# Patient Record
Sex: Female | Born: 1985 | Race: White | Hispanic: No | Marital: Married | Smoking: Former smoker
Health system: Southern US, Community
[De-identification: ages and names within clinical notes are randomized; demographics above are authoritative.]

## PROBLEM LIST (undated history)

## (undated) ENCOUNTER — Inpatient Hospital Stay (HOSPITAL_COMMUNITY): Payer: Self-pay

## (undated) DIAGNOSIS — Z789 Other specified health status: Secondary | ICD-10-CM

## (undated) DIAGNOSIS — F419 Anxiety disorder, unspecified: Secondary | ICD-10-CM

## (undated) DIAGNOSIS — N200 Calculus of kidney: Secondary | ICD-10-CM

## (undated) DIAGNOSIS — F32A Depression, unspecified: Secondary | ICD-10-CM

## (undated) DIAGNOSIS — Z319 Encounter for procreative management, unspecified: Secondary | ICD-10-CM

## (undated) DIAGNOSIS — Z1589 Genetic susceptibility to other disease: Secondary | ICD-10-CM

## (undated) HISTORY — PX: OTHER SURGICAL HISTORY: SHX169

## (undated) HISTORY — PX: DILATION AND CURETTAGE OF UTERUS: SHX78

---
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
## 2012-04-11 ENCOUNTER — Encounter: Payer: Self-pay | Admitting: Obstetrics and Gynecology

## 2012-04-19 ENCOUNTER — Ambulatory Visit (INDEPENDENT_AMBULATORY_CARE_PROVIDER_SITE_OTHER): Payer: PRIVATE HEALTH INSURANCE | Admitting: Obstetrics and Gynecology

## 2012-04-19 ENCOUNTER — Encounter: Payer: Self-pay | Admitting: Obstetrics and Gynecology

## 2012-04-19 VITALS — BP 110/70 | Ht 67.0 in | Wt 130.0 lb

## 2012-04-19 DIAGNOSIS — Z01419 Encounter for gynecological examination (general) (routine) without abnormal findings: Secondary | ICD-10-CM

## 2012-04-19 DIAGNOSIS — Z124 Encounter for screening for malignant neoplasm of cervix: Secondary | ICD-10-CM

## 2012-04-19 NOTE — Progress Notes (Signed)
Last Pap: 2011 WNL: Yes Regular Periods:yes Contraception: IUD inserted 07/2010  Monthly Breast exam:yes Tetanus<31yrs:yes Nl.Bladder Function:yes Daily BMs:yes Healthy Diet:yes Calcium:no Mammogram:no Exercise:yes Seatbelt: yes Abuse at home: no Stressful work:yes Sigmoid-colonoscopy: n/a Bone Density: No  Subjective:    Melissa Doyle is a 26 y.o. female, No obstetric history on file., who presents for a new patient annual exam. See above. She had a Mirena IUD placed into thousand and 11.  The patient has a maternal aunt and a first cousin (age 45) with breast cancer.  Prior Hysterectomy: No    History   Social History  . Marital Status: Married    Spouse Name: N/A    Number of Children: N/A  . Years of Education: N/A   Social History Main Topics  . Smoking status: Never Smoker   . Smokeless tobacco: None  . Alcohol Use: None  . Drug Use: None  . Sexually Active: None   Other Topics Concern  . None   Social History Narrative  . None    Menstrual cycle:   LMP: Patient's last menstrual period was 04/03/2012.           Cycle: Regular, monthly with normal flow and no severe dysmenorrha  The following portions of the patient's history were reviewed and updated as appropriate: allergies, current medications, past family history, past medical history, past social history, past surgical history and problem list.  Review of Systems Pertinent items are noted in HPI. Breast:Negative for breast lump,nipple discharge or nipple retraction Gastrointestinal: Negative for abdominal pain, change in bowel habits or rectal bleeding Urinary:negative   Objective:    BP 110/70  Ht 5\' 7"  (1.702 m)  Wt 130 lb (58.968 kg)  BMI 20.36 kg/m2  LMP 04/03/2012    Weight:  Wt Readings from Last 1 Encounters:  04/19/12 130 lb (58.968 kg)          BMI: Body mass index is 20.36 kg/(m^2).  General Appearance: Alert, appropriate appearance for age. No acute distress HEENT: Grossly  normal Neck / Thyroid: Supple, no masses, nodes or enlargement Lungs: clear to auscultation bilaterally Back: No CVA tenderness Breast Exam: No masses or nodes.No dimpling, nipple retraction or discharge. Cardiovascular: Regular rate and rhythm. S1, S2, no murmur Gastrointestinal: Soft, non-tender, no masses or organomegaly  ++++++++++++++++++++++++++++++++++++++++++++++++++++++++  Pelvic Exam: External genitalia: normal general appearance Vaginal: normal without tenderness, induration or masses Cervix: normal appearance Adnexa: normal bimanual exam Uterus: normal size shape and consistency Rectovaginal: not indicated IUD string seen in the cervix  ++++++++++++++++++++++++++++++++++++++++++++++++++++++++  Lymphatic Exam: Non-palpable nodes in neck, clavicular, axillary, or inguinal regions Neurologic: Normal speech, no tremor  Psychiatric: Alert and oriented, appropriate affect.   Wet Prep:not applicable Urinalysis:not applicable UPT: Not done   Assessment:    Normal gyn exam   Overweight or obese: No   Pelvic relaxation: No  Maternal aunt and first cousin with breast cancer   Plan:    pap smear return annually or prn Contraception:IUD  STD screen request: none  RPR: No.   HBsAg: No.  Hepatitis C: No.  The updated Pap smear screening guidelines were discussed with the patient. The patient requested that I obtain a Pap smear: Yes.  Kegel exercises discussed: Yes.  Proper diet and regular exercise were reviewed.  Annual mammograms recommended starting at age 32. Proper breast care was discussed. Patient we'll check the results of the BRCA testing her cousin.  If her cousin is positive then we will test our patient.  Screening colonoscopy  is recommended beginning at age 60.  Regular health maintenance was reviewed.  Sleep hygiene was discussed.  Mylinda Latina.D.

## 2012-04-24 LAB — PAP IG W/ RFLX HPV ASCU

## 2012-05-29 ENCOUNTER — Telehealth: Payer: Self-pay | Admitting: Obstetrics and Gynecology

## 2012-05-30 ENCOUNTER — Telehealth: Payer: Self-pay

## 2012-05-30 NOTE — Telephone Encounter (Signed)
Lm for pt to call back

## 2012-06-04 ENCOUNTER — Telehealth: Payer: Self-pay | Admitting: Obstetrics and Gynecology

## 2012-06-04 NOTE — Telephone Encounter (Signed)
TC to pt. LM to return call regarding message. 

## 2012-06-05 ENCOUNTER — Telehealth: Payer: Self-pay | Admitting: Obstetrics and Gynecology

## 2012-06-05 NOTE — Telephone Encounter (Signed)
TC from pt.   States spoke with Dr AVS at last visit. Pt's maternal aunt and her daughter diagnosed with breast cancer. Pt's cousin has tested positive  for  BRCA1  Gene.   Questioning if needs testing and/or early mammogram.  Mother is also in process of testing for BRCA gene.  Dr AVS to be consulted.  Pt agreeable.

## 2012-06-05 NOTE — Telephone Encounter (Signed)
TC to Brandy ir response to VM regarding treatment for +GC 05/09/12.  Unable to verify was treated.  Message sent to [facility] to review.

## 2012-06-06 ENCOUNTER — Telehealth: Payer: Self-pay | Admitting: Obstetrics and Gynecology

## 2012-06-06 NOTE — Telephone Encounter (Signed)
TC to pt. LM to return call.  

## 2015-12-08 DIAGNOSIS — Z30433 Encounter for removal and reinsertion of intrauterine contraceptive device: Secondary | ICD-10-CM | POA: Insufficient documentation

## 2016-04-15 DIAGNOSIS — F418 Other specified anxiety disorders: Secondary | ICD-10-CM | POA: Insufficient documentation

## 2016-11-04 ENCOUNTER — Ambulatory Visit (INDEPENDENT_AMBULATORY_CARE_PROVIDER_SITE_OTHER): Payer: BLUE CROSS/BLUE SHIELD | Admitting: Internal Medicine

## 2016-11-04 DIAGNOSIS — Z9189 Other specified personal risk factors, not elsewhere classified: Secondary | ICD-10-CM

## 2016-11-04 MED ORDER — ELVITEG-COBIC-EMTRICIT-TENOFAF 150-150-200-10 MG PO TABS: 1.0000 | ORAL_TABLET | Freq: Every day | ORAL | 0 refills | Status: DC

## 2016-11-04 MED ORDER — ATOVAQUONE-PROGUANIL HCL 250-100 MG PO TABS: 1.0000 | ORAL_TABLET | Freq: Every day | ORAL | 0 refills | Status: DC

## 2016-11-04 MED ORDER — TYPHOID VACCINE PO CPDR: 1.0000 | DELAYED_RELEASE_CAPSULE | ORAL | 0 refills | Status: DC

## 2016-11-04 MED ORDER — AZITHROMYCIN 500 MG PO TABS: 500.0000 mg | ORAL_TABLET | Freq: Every day | ORAL | 0 refills | Status: DC

## 2016-11-04 NOTE — Patient Instructions (Signed)
[facility]                [address]                   [place], [place] [id number]                      Phone: [phone]                        Fax: [phone]   Planned departure date:   Jan 17th   Planned return date: Jan 28th Countries of travel: [place]  Guidelines for the Prevention & Treatment of Traveler's Diarrhea  Prevention: "Boil it, Peel it, Adriana SimasCook it, or Forget it"   the fewer chances -> lower risk: try to stick to food & water precautions as much as possible"   If it's "piping hot"; it is probably okay, if not, it may not be   Treatment   1) You should always take care to drink lots of fluids in order to avoid dehydration   2) You should bring medications with you in case you come down with a case of diarrhea   3) OTC = bring pepto-bismol - can take with initial abdominal symptoms;                    Imodium - can help slow down your intestinal tract, can help relief cramps                    and diarrhea, can take if no bloody diarrhea  Use azithromycin if needed for traveler's diarrhea  In case of needle stick of blood exposure from HIV patient during medical mission: - wash the area of puncture with soap and water - recommend to get baseline hiv testing  - start taking genvoya with small amount of food, to take daily until complete.more or less same time of day - when you return to the [place], please give us a call for further work up  Guidelines for the Prevention of Malaria  Avoidance:  -fewer mosquito bites = lower risk. Mosquitos can bite at night as well as daytime  -cover up (long sleeve clothing), mosquito nets, screens  -Insect repellent for your skin ( DEET containing lotion > 20%): for clothes ( permethrin spray)   Two days prior to leaving, start malarone for malaria prevention.   Immunizations received today: hepatitis A Future immunizations, if indicated: please check if you have  received your meningitis vaccine  Prior to travel:  1) Be sure to pick up appropriate prescriptions, including medicine you take daily. Do not expect to be able to fill your prescriptions abroad.  2) Strongly consider obtaining traveler's insurance, including emergency evacuation insurance. Most plans in the [place] do not cover participants abroad. (see below for resources)  3) Register at the appropriate [place] embassy or consulate with travel dates so they are aware of your presence in-country and for helpful advice during travel using the [employer] Traveler Enrollment Program (STEP, GuyGalaxy.sihttps://step.state.gov/step).  4) Leave contact information with a relative or friend.  5) Keep a Corporate treasurerphotocopy passport, credit cards in case they become lost or stolen  6) Inform your credit card company that you will be travelling abroad   During travel:  1) If you become ill and need medical advice, the U.[place] WellPointembassy website of the country  you are traveling in general provides a list of English speaking doctors.  We are also available on MyChart for remote consultation if you register prior to travel. 2) Avoid motorcycles or scooters when at all possible. Traffic laws in many countries are lax and accidents occur frequently.  3) Do not take any unnecessary risks that you wouldn't do at home.   Resources:  -Country specific information: http://www.church.org/www.cdc.gov/travel or GuyGalaxy.sihttps://step.state.gov/step  -Chief Strategy OfficerTravel Supplies (DEET, mosquito nets): [employer], [employer] Goods store, [employer], [employer]  -Travel insurance options: gatewayplans.com; FixItGel.esmedexassist.com; travelguard.com or Good Mat[employer]ighbor Insurance, gninsurance.com or info@gninsurance .com, O5250554641-479-1523.   Post Travel:  If you return from your trip ill, call your primary care doctor or our travel clinic @ [phone].   Enjoy your trip and know that with proper pre-travel preparation, most people have an enjoyable and uninterrupted trip!

## 2016-11-04 NOTE — Progress Notes (Signed)
Subjective:   Melissa Doyle is a 30 y.o. female who presents to the Infectious Disease clinic for travel consultation. 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 [place]) Areas in country: unknown Accommodations: guest houses, and hotels Purpose of travel: medical mission/education Prior travel out of [place]: yes  - she is uptodate on tdap, mmr, hep B, and other childhood vaccine   Objective:   Medications:    Assessment:   No contraindications to travel. none     Plan:    pre travel vaccination = recommend hep A and yellow fever and meningococcal if she has not already received it. 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 I recommend to bring with her a month's dose of genvoya to use if needed for post exposure prophylaxis. Gave instructions to how to take medicines and co-pay card

## 2016-11-17 ENCOUNTER — Ambulatory Visit: Payer: BLUE CROSS/BLUE SHIELD

## 2017-07-04 DIAGNOSIS — R053 Chronic cough: Secondary | ICD-10-CM | POA: Insufficient documentation

## 2017-07-04 DIAGNOSIS — J309 Allergic rhinitis, unspecified: Secondary | ICD-10-CM | POA: Insufficient documentation

## 2018-05-07 DIAGNOSIS — D229 Melanocytic nevi, unspecified: Secondary | ICD-10-CM | POA: Insufficient documentation

## 2018-06-02 ENCOUNTER — Encounter (HOSPITAL_COMMUNITY): Payer: Self-pay | Admitting: *Deleted

## 2018-06-02 ENCOUNTER — Inpatient Hospital Stay (HOSPITAL_COMMUNITY): Payer: PRIVATE HEALTH INSURANCE

## 2018-06-02 ENCOUNTER — Inpatient Hospital Stay (HOSPITAL_COMMUNITY)
Admission: AD | Admit: 2018-06-02 | Discharge: 2018-06-02 | Disposition: A | Discharge status: Home / Self Care | Payer: PRIVATE HEALTH INSURANCE | Source: Ambulatory Visit | Attending: Obstetrics and Gynecology | Admitting: Obstetrics and Gynecology

## 2018-06-02 DIAGNOSIS — Z87891 Personal history of nicotine dependence: Secondary | ICD-10-CM | POA: Diagnosis not present

## 2018-06-02 DIAGNOSIS — Z3A01 Less than 8 weeks gestation of pregnancy: Secondary | ICD-10-CM | POA: Insufficient documentation

## 2018-06-02 DIAGNOSIS — O209 Hemorrhage in early pregnancy, unspecified: Secondary | ICD-10-CM | POA: Diagnosis not present

## 2018-06-02 DIAGNOSIS — O208 Other hemorrhage in early pregnancy: Secondary | ICD-10-CM | POA: Insufficient documentation

## 2018-06-02 DIAGNOSIS — R109 Unspecified abdominal pain: Secondary | ICD-10-CM | POA: Diagnosis present

## 2018-06-02 DIAGNOSIS — O26891 Other specified pregnancy related conditions, first trimester: Secondary | ICD-10-CM | POA: Diagnosis not present

## 2018-06-02 HISTORY — DX: Other specified health status: Z78.9

## 2018-06-02 LAB — CBC WITH DIFFERENTIAL/PLATELET
Basophils Absolute: 0 10*3/uL (ref 0.0–0.1)
Basophils Relative: 1 %
EOS PCT: 2 %
Eosinophils Absolute: 0.1 10*3/uL (ref 0.0–0.7)
HCT: 40.7 % (ref 36.0–46.0)
Hemoglobin: 13.5 g/dL (ref 12.0–15.0)
LYMPHS ABS: 2.1 10*3/uL (ref 0.7–4.0)
Lymphocytes Relative: 34 %
MCH: 29.3 pg (ref 26.0–34.0)
MCHC: 33.2 g/dL (ref 30.0–36.0)
MCV: 88.5 fL (ref 78.0–100.0)
Monocytes Absolute: 0.3 10*3/uL (ref 0.1–1.0)
Monocytes Relative: 5 %
NEUTROS PCT: 58 %
Neutro Abs: 3.5 10*3/uL (ref 1.7–7.7)
Platelets: 290 10*3/uL (ref 150–400)
RBC: 4.6 MIL/uL (ref 3.87–5.11)
RDW: 12.8 % (ref 11.5–15.5)
WBC: 6.1 10*3/uL (ref 4.0–10.5)

## 2018-06-02 LAB — URINALYSIS, ROUTINE W REFLEX MICROSCOPIC
Bilirubin Urine: NEGATIVE
GLUCOSE, UA: NEGATIVE mg/dL
Ketones, ur: NEGATIVE mg/dL
Leukocytes, UA: NEGATIVE
NITRITE: NEGATIVE
PH: 7 (ref 5.0–8.0)
Protein, ur: NEGATIVE mg/dL
SPECIFIC GRAVITY, URINE: 1.002 — AB (ref 1.005–1.030)

## 2018-06-02 LAB — POCT PREGNANCY, URINE: PREG TEST UR: POSITIVE — AB

## 2018-06-02 LAB — HCG, QUANTITATIVE, PREGNANCY: hCG, Beta Chain, Quant, S: 5271 m[IU]/mL — ABNORMAL HIGH (ref <5)

## 2018-06-02 LAB — ABO/RH: ABO/RH(D): O NEG

## 2018-06-02 MED ORDER — RHO D IMMUNE GLOBULIN 1500 UNIT/2ML IJ SOSY
300.0000 ug | PREFILLED_SYRINGE | Freq: Once | INTRAMUSCULAR | Status: AC
  Administered 2018-06-02: 300 ug via INTRAMUSCULAR
  Filled 2018-06-02: qty 2

## 2018-06-02 NOTE — MAU Note (Signed)
Melissa Doyle is a 32 y.o. at Unknown here in MAU reporting:  +vaginal bleeding Spotting in nature [name] in color Not having to wear a pad Last intercourse tuesday LMP: 04/12/18 Started yesterday  +lower abdominal cramping None now Started Wednesday night Intermittent Followed by diarrhea  Pain score: none currently Called on call and they told her to come in Vitals:   06/02/18 1333  BP: 135/85  Pulse: (!) 110  Resp: 18  Temp: 98.2 F (36.8 C)  SpO2: 100%     Lab orders placed from triage: ua and pregnancy test

## 2018-06-02 NOTE — Discharge Instructions (Signed)
Vaginal Bleeding During Pregnancy, First Trimester °A small amount of bleeding (spotting) from the vagina is common in early pregnancy. Sometimes the bleeding is normal and is not a problem, and sometimes it is a sign of something serious. Be sure to tell your doctor about any bleeding from your vagina right away. °Follow these instructions at home: °· Watch your condition for any changes. °· Follow your doctor's instructions about how active you can be. °· If you are on bed rest: °? You may need to stay in bed and only get up to use the bathroom. °? You may be allowed to do some activities. °? If you need help, make plans for someone to help you. °· Write down: °? The number of pads you use each day. °? How often you change pads. °? How soaked (saturated) your pads are. °· Do not use tampons. °· Do not douche. °· Do not have sex or orgasms until your doctor says it is okay. °· If you pass any tissue from your vagina, save the tissue so you can show it to your doctor. °· Only take medicines as told by your doctor. °· Do not take aspirin because it can make you bleed. °· Keep all follow-up visits as told by your doctor. °Contact a doctor if: °· You bleed from your vagina. °· You have cramps. °· You have labor pains. °· You have a fever that does not go away after you take medicine. °Get help right away if: °· You have very bad cramps in your back or belly (abdomen). °· You pass large clots or tissue from your vagina. °· You bleed more. °· You feel light-headed or weak. °· You pass out (faint). °· You have chills. °· You are leaking fluid or have a gush of fluid from your vagina. °· You pass out while pooping (having a bowel movement). °This information is not intended to replace advice given to you by your health care provider. Make sure you discuss any questions you have with your health care provider. °Document Released: 04/07/2014 Document Revised: 04/28/2016 Document Reviewed: 07/29/2013 °Elsevier Interactive  Patient Education © 2018 Elsevier Inc. ° °

## 2018-06-02 NOTE — MAU Provider Note (Signed)
History   Patient is a 32 year old G1 at approximately 7 weeks and 3 days by LMP and today with cramping since Wednesday and light pink spotting since arising this morning.  She denies any other complaints.  CSN: [id number]  Arrival date & time 06/02/18  1305   First Provider Initiated Contact with Patient 06/02/18 1355      Chief Complaint  Patient presents with  . Abdominal Pain  . Vaginal Bleeding    HPI  Past Medical History:  Diagnosis Date  . Medical history non-contributory     Past Surgical History:  Procedure Laterality Date  . WISDOM TOOTH EXTRACTION  2003    Family History  Problem Relation Age of Onset  . Diabetes type II Maternal Grandmother   . Hypertension Maternal Grandmother   . Colon cancer Maternal Grandfather   . Breast cancer Maternal Aunt   . Breast cancer Cousin   . Cancer Father     Social History   Tobacco Use  . Smoking status: Former Games developermoker  . Smokeless tobacco: Never Used  Substance Use Topics  . Alcohol use: Not Currently    Comment: social   . Drug use: Not Currently    OB History    Gravida  1   Para      Term      Preterm      AB      Living        SAB      TAB      Ectopic      Multiple      Live Births              Review of Systems  Allergies  Patient has no known allergies.  Home Medications    BP 135/85 (BP Location: Right Arm)   Pulse (!) 110   Temp 98.2 F (36.8 C) (Oral)   Resp 18   Wt 149 lb 1.9 oz (67.6 kg)   LMP 04/12/2018   SpO2 100% Comment: ra  BMI 23.36 kg/m   Physical Exam  MAU Course  Procedures (including critical care time)  Labs Reviewed  POCT PREGNANCY, URINE - Abnormal; Notable for the following components:      Result Value   Preg Test, Ur POSITIVE (*)    All other components within normal limits  URINALYSIS, ROUTINE W REFLEX MICROSCOPIC  CBC WITH DIFFERENTIAL/PLATELET  HCG, QUANTITATIVE, PREGNANCY  ABO/RH   No results found. Results for orders placed  or performed during the hospital encounter of 06/02/18 (from the past 24 hour(s))  Pregnancy, urine POC     Status: Abnormal   Collection Time: 06/02/18  1:55 PM  Result Value Ref Range   Preg Test, Ur POSITIVE (A) NEGATIVE  Urinalysis, Routine w reflex microscopic     Status: Abnormal   Collection Time: 06/02/18  1:57 PM  Result Value Ref Range   Color, Urine STRAW (A) YELLOW   APPearance HAZY (A) CLEAR   Specific Gravity, Urine 1.002 (L) 1.005 - 1.030   pH 7.0 5.0 - 8.0   Glucose, UA NEGATIVE NEGATIVE mg/dL   Hgb urine dipstick MODERATE (A) NEGATIVE   Bilirubin Urine NEGATIVE NEGATIVE   Ketones, ur NEGATIVE NEGATIVE mg/dL   Protein, ur NEGATIVE NEGATIVE mg/dL   Nitrite NEGATIVE NEGATIVE   Leukocytes, UA NEGATIVE NEGATIVE   RBC / HPF 0-5 0 - 5 RBC/hpf   WBC, UA 0-5 0 - 5 WBC/hpf   Bacteria, UA MANY (A) NONE SEEN  Squamous Epithelial / LPF 0-5 0 - 5   Mucus PRESENT   CBC with Differential/Platelet     Status: None   Collection Time: 06/02/18  2:12 PM  Result Value Ref Range   WBC 6.1 4.0 - 10.5 K/uL   RBC 4.60 3.87 - 5.11 MIL/uL   Hemoglobin 13.5 12.0 - 15.0 g/dL   HCT 02.7 25.3 - 66.4 %   MCV 88.5 78.0 - 100.0 fL   MCH 29.3 26.0 - 34.0 pg   MCHC 33.2 30.0 - 36.0 g/dL   RDW 40.3 47.4 - 25.9 %   Platelets 290 150 - 400 K/uL   Neutrophils Relative % 58 %   Neutro Abs 3.5 1.7 - 7.7 K/uL   Lymphocytes Relative 34 %   Lymphs Abs 2.1 0.7 - 4.0 K/uL   Monocytes Relative 5 %   Monocytes Absolute 0.3 0.1 - 1.0 K/uL   Eosinophils Relative 2 %   Eosinophils Absolute 0.1 0.0 - 0.7 K/uL   Basophils Relative 1 %   Basophils Absolute 0.0 0.0 - 0.1 K/uL  ABO/Rh     Status: None (Preliminary result)   Collection Time: 06/02/18  2:12 PM  Result Value Ref Range   ABO/RH(D)      O NEG Performed at [facility], [address] [place], [place] [address]   hCG, quantitative, pregnancy     Status: Abnormal   Collection Time: 06/02/18  2:12 PM  Result Value Ref Range   hCG, Beta  Chain, Quant, S 5,271 (H) <5 mIU/mL  Rh IG workup (includes ABO/Rh)     Status: None (Preliminary result)   Collection Time: 06/02/18  2:12 PM  Result Value Ref Range   Gestational Age(Wks) 7.2    ABO/RH(D) O NEG    Antibody Screen NEG    Unit Number [id number]    Blood Component Type RHIG    Unit division 00    Status of Unit ISSUED    Transfusion Status      OK TO TRANSFUSE Performed at [facility], [address] [place], [place] [address]     No diagnosis found.    MDM  Vital signs are stable.  Patient has bloody amount of vaginal bleeding present on admission.  Ultrasound shows IUP with gestational sac and yolk sac.  Sharene Butters is [id number].  Patient is stable.  Spoke with Dr. Senaida Ores regarding plan of care office is to call her Monday in regards to follow-up.  She knows if she has increased bleeding she is to call the office.

## 2018-06-02 NOTE — Progress Notes (Addendum)
G1 @ [redacted]wksga. Here for cramping that started on Wednesday and tiny bleeding yesterday. Brown color. Last intercourse was on Tuesday.   Last period May 9. HPT+ on June 3rd [facility] 1355: provider at bs. Labs and U/S ordered. Instructed pt to use call light once lab is drawn. Pt verbalized understanding.   1400: lab notified for stat labs.   1415: Lab at bs.   To U/s taken by nurse tech. pt back from U/s  1556: U/S result back. Provider made aware.   1638: pending Rhogam. Lab notified and aware.   1640: provider at bs reassessing. POC discussed. Pt made aware of receiving Rhogam  1654: lab called informed rhogam ready  1705 Rhogam administered. Verified with RN Joni ReiningNicole. Pt tolerated it well.   1727: d/c instructions given with pt understanding. Pt left unit via ambulatory with SO

## 2018-06-03 LAB — RH IG WORKUP (INCLUDES ABO/RH)
ABO/RH(D): O NEG
ANTIBODY SCREEN: NEGATIVE
Gestational Age(Wks): 7.2
Unit division: 0

## 2019-04-11 ENCOUNTER — Encounter (HOSPITAL_COMMUNITY): Payer: Self-pay

## 2020-01-12 IMAGING — US US OB < 14 WEEKS - US OB TV
1 series · 15 of 15 positions shown · non-contrast
Comparison: None.

CLINICAL DATA: Vaginal bleeding.

EXAM:
OBSTETRIC <14 WK US AND TRANSVAGINAL OB US
TECHNIQUE: Both transabdominal and transvaginal ultrasound examinations were
performed for complete evaluation of the gestation as well as the
maternal uterus, adnexal regions, and pelvic cul-de-sac.
Transvaginal technique was performed to assess early pregnancy.

[Series 1: us ob < 14 weeks - us ob tv · 15 of 15 slices shown]
[im 1/15]
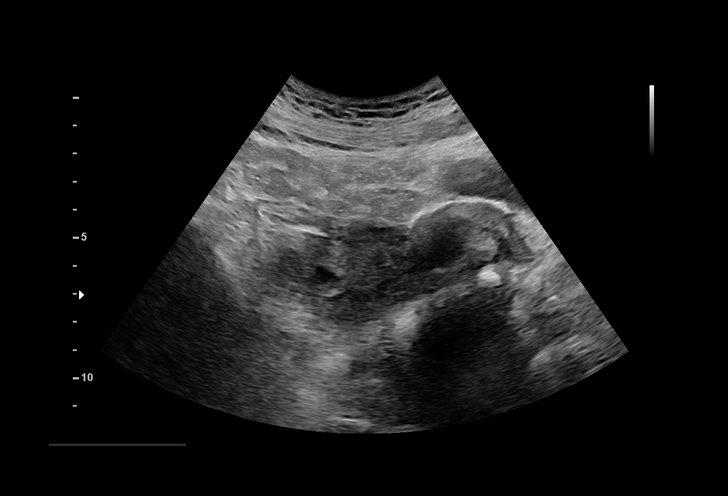
[im 2/15]
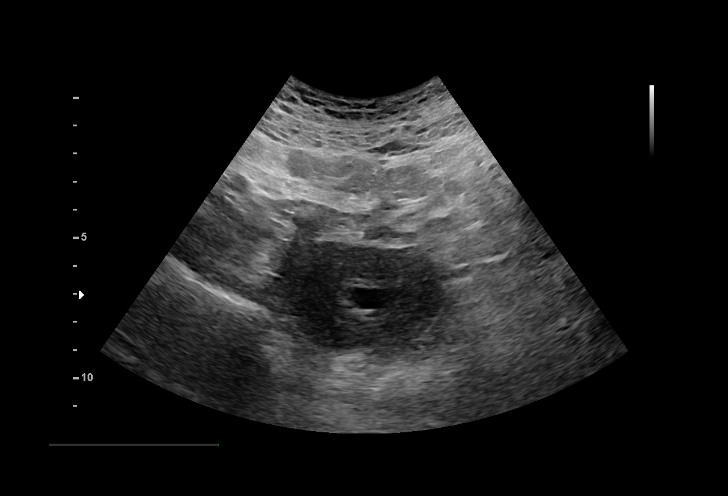
[im 3/15]
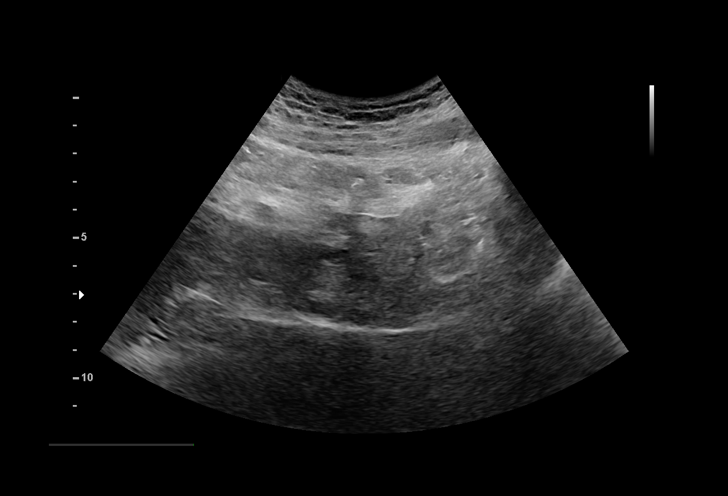
[im 4/15]
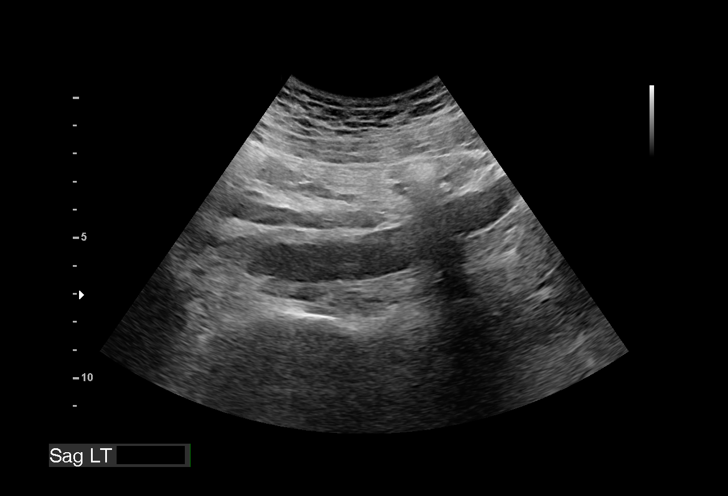
[im 5/15]
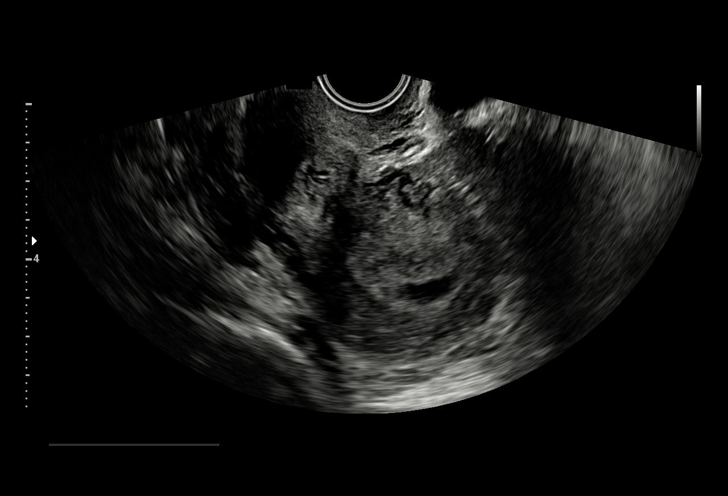
[im 6/15]
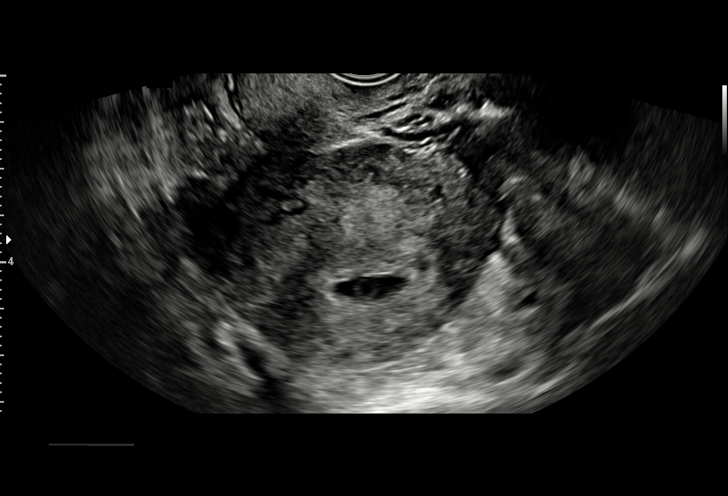
[im 7/15]
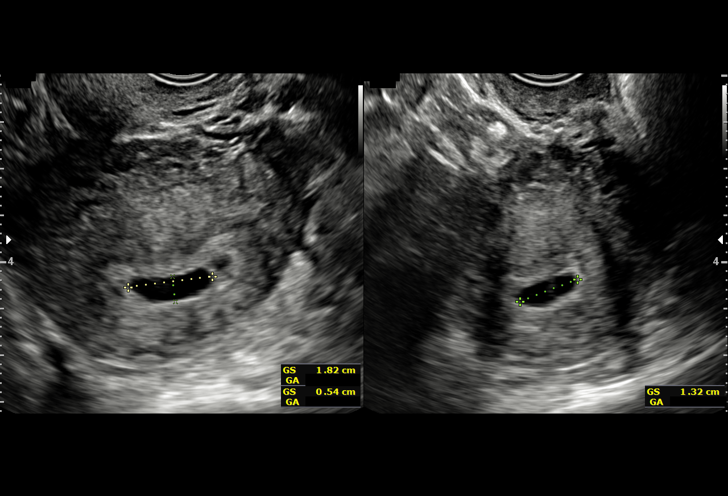
[im 8/15]
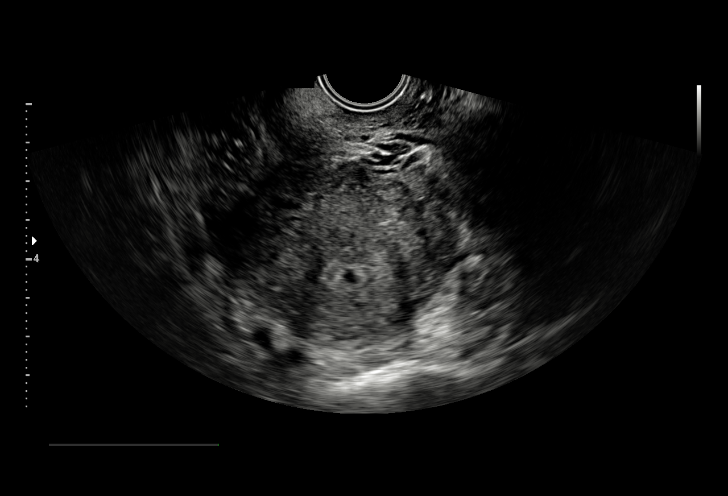
[im 9/15]
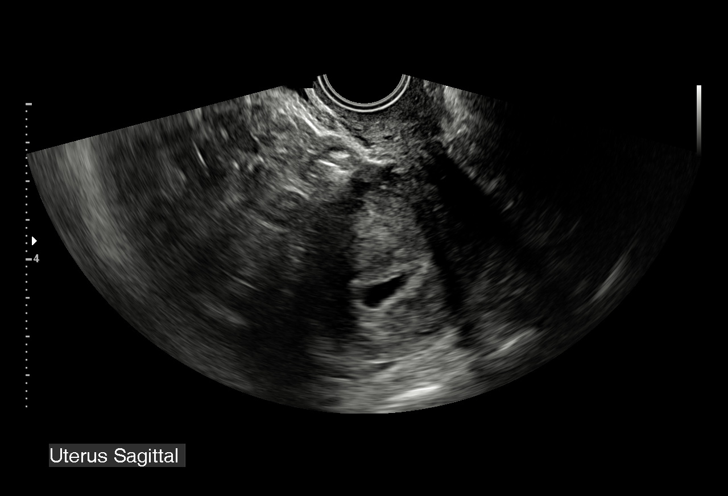
[im 10/15]
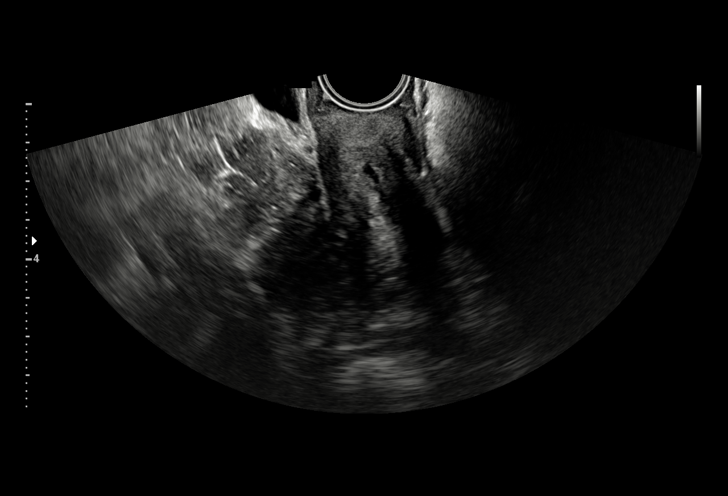
[im 11/15]
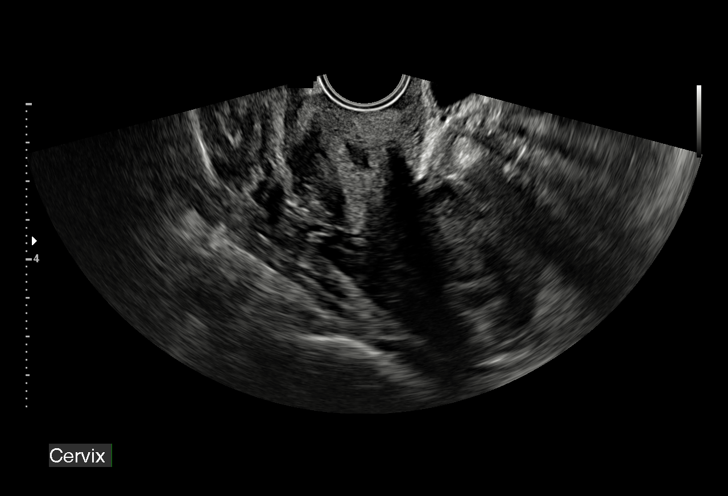
[im 12/15]
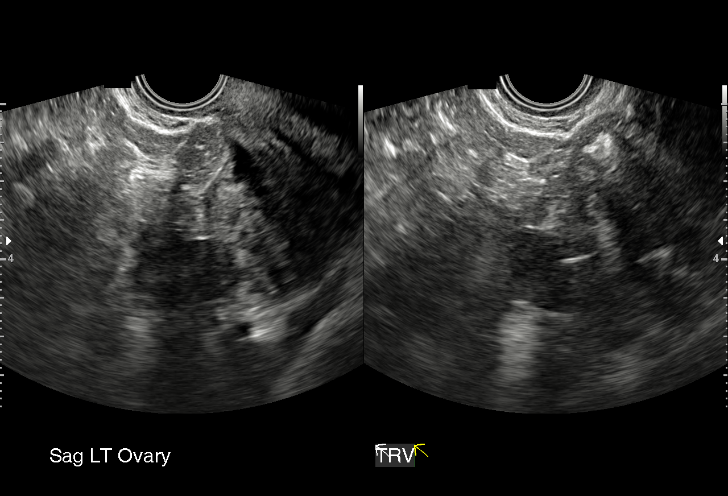
[im 13/15]
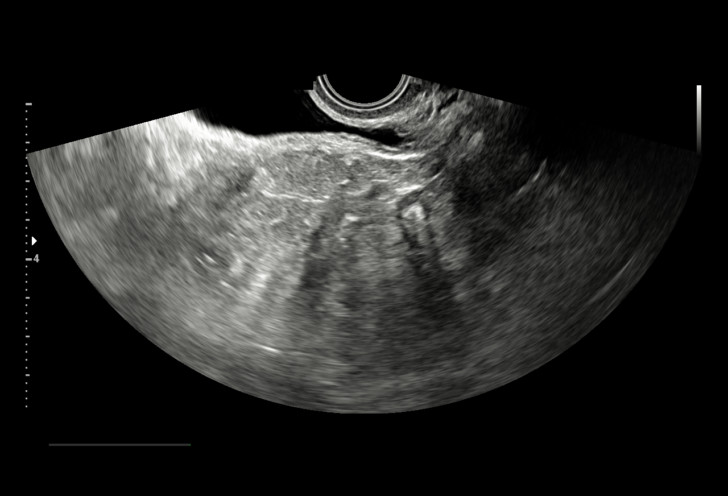
[im 14/15]
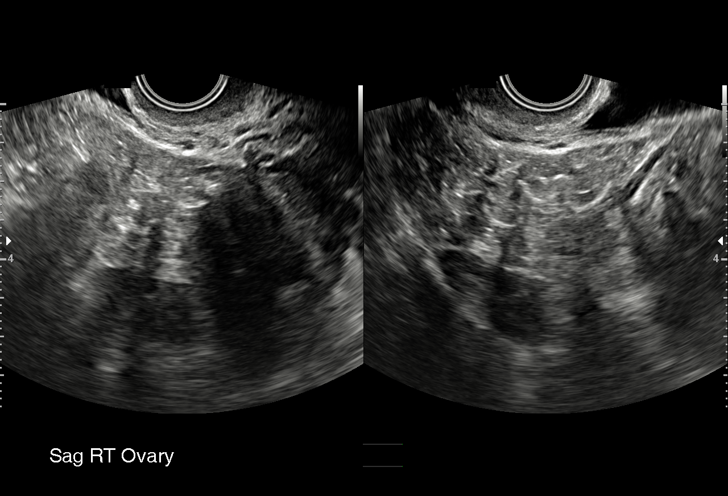
[im 15/15]
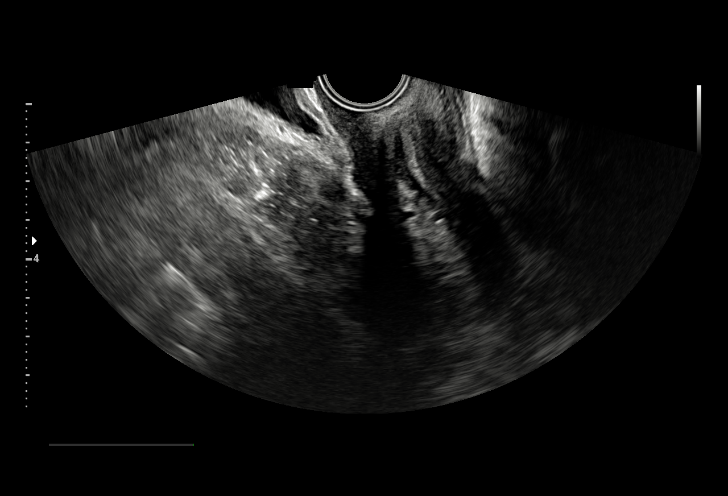

[15 of 15 positions shown; findings below may reference images not displayed]

FINDINGS: Intrauterine gestational sac: Single

Yolk sac:  Present

Embryo:  Not Seen

MSD: 12.3 mm   6 w   0 d

Maternal uterus/adnexae: Normal.
IMPRESSION: Intrauterine gestational sac and yolk sac present, however no fetal
pole is seen. Findings are suspicious, but not conclusive for a
failed pregnancy. Recommend follow-up US in 10-14 days for
definitive diagnosis. This recommendation follows SRU consensus
guidelines: Diagnostic Criteria for Nonviable Pregnancy Early in the
First Trimester. N Engl J Med 8961; [DATE].

## 2021-01-25 DIAGNOSIS — Z1589 Genetic susceptibility to other disease: Secondary | ICD-10-CM | POA: Insufficient documentation

## 2023-11-28 ENCOUNTER — Encounter (HOSPITAL_COMMUNITY): Payer: Self-pay | Admitting: Obstetrics and Gynecology

## 2023-11-28 ENCOUNTER — Inpatient Hospital Stay (HOSPITAL_COMMUNITY)
Admission: AD | Admit: 2023-11-28 | Discharge: 2023-11-28 | Disposition: A | Discharge status: Home / Self Care | Payer: PRIVATE HEALTH INSURANCE | Attending: Obstetrics and Gynecology | Admitting: Obstetrics and Gynecology

## 2023-11-28 DIAGNOSIS — O8612 Endometritis following delivery: Secondary | ICD-10-CM | POA: Insufficient documentation

## 2023-11-28 DIAGNOSIS — Z98891 History of uterine scar from previous surgery: Secondary | ICD-10-CM | POA: Insufficient documentation

## 2023-11-28 DIAGNOSIS — N719 Inflammatory disease of uterus, unspecified: Secondary | ICD-10-CM

## 2023-11-28 HISTORY — DX: Depression, unspecified: F32.A

## 2023-11-28 HISTORY — DX: Calculus of kidney: N20.0

## 2023-11-28 HISTORY — DX: Encounter for procreative management, unspecified: Z31.9

## 2023-11-28 HISTORY — DX: Anxiety disorder, unspecified: F41.9

## 2023-11-28 HISTORY — DX: Genetic susceptibility to other disease: Z15.89

## 2023-11-28 LAB — CBC
HCT: 33.1 % — ABNORMAL LOW (ref 36.0–46.0)
Hemoglobin: 10.8 g/dL — ABNORMAL LOW (ref 12.0–15.0)
MCH: 29.6 pg (ref 26.0–34.0)
MCHC: 32.6 g/dL (ref 30.0–36.0)
MCV: 90.7 fL (ref 80.0–100.0)
Platelets: 196 10*3/uL (ref 150–400)
RBC: 3.65 MIL/uL — ABNORMAL LOW (ref 3.87–5.11)
RDW: 14.1 % (ref 11.5–15.5)
WBC: 11.5 10*3/uL — ABNORMAL HIGH (ref 4.0–10.5)
nRBC: 0 % (ref 0.0–0.2)

## 2023-11-28 MED ORDER — AMOXICILLIN-POT CLAVULANATE 875-125 MG PO TABS: 1.0000 | ORAL_TABLET | Freq: Two times a day (BID) | ORAL | 0 refills | Status: DC

## 2023-11-28 NOTE — MAU Provider Note (Signed)
Vaginal bleeding s/p rLTCS  S [name] is a 37 y.o. [phone] non-pregnant female at Unknown who presents to MAU today with complaint of vaginal bleeding following her rLTCS 11/24/23 at [facility].  Pt has genetic mutation necessitating Lovenox and has been compliant with AC.  This morning had a gush of blood and decided to present for evaluation. States at 1300 today she was feeding her infant and had a gush.  She does states that she was "overdoing it" today as went to doctor's appointment, then walked around grocery store and lifted her 30lb toddler multiple times prior to the gush of bleeding. She states she had some subjective chills she thought 2/2 hormones but denies objective fevers or other symptoms. Care everywhere surgical note not significant for any mention of risk factors for retained products.   Receives care at [facility]. Prenatal records reviewed.  Pertinent items noted in HPI and remainder of comprehensive ROS otherwise negative.   O BP 133/82   Pulse 83   Temp 99.4 F (37.4 C) (Oral)   Resp 18   LMP  (LMP Unknown)   SpO2 99%   Breastfeeding Yes  Physical Exam Vitals and nursing note reviewed. Exam conducted with a chaperone present.  Constitutional:      General: She is not in acute distress.    Appearance: Normal appearance. She is not ill-appearing.  HENT:     Head: Normocephalic and atraumatic.     Right Ear: External ear normal.     Left Ear: External ear normal.     Nose: Nose normal.     Mouth/Throat:     Mouth: Mucous membranes are moist.     Pharynx: Oropharynx is clear.  Eyes:     Extraocular Movements: Extraocular movements intact.     Conjunctiva/sclera: Conjunctivae normal.  Cardiovascular:     Rate and Rhythm: Normal rate.  Pulmonary:     Effort: Pulmonary effort is normal. No respiratory distress.  Abdominal:     Palpations: Abdomen is soft.     Tenderness: There is no abdominal tenderness.  Genitourinary:    General: Normal vulva.      Rectum: Normal.     Comments: Fundus firm, about 100 cc of red blood on bad and pooling in the vaginal vault, no active bleeding or tissues from cervical os with fundal rub, no obvious laceration Musculoskeletal:        General: No swelling. Normal range of motion.     Cervical back: Normal range of motion.  Skin:    General: Skin is warm and dry.     Comments: Compression stocking on b/l lower extremities, low transverse incision from csection with steri strips still in place no separation or d/c  Neurological:     Mental Status: She is alert and oriented to person, place, and time. Mental status is at baseline.     Motor: No weakness.     Gait: Gait normal.  Psychiatric:        Behavior: Behavior normal.     Comments: anxious      MDM: low  MAU Course:  Pt with heavy lifting and lots of physical activity today with bleeding with feed of newborn.  Pooling and estimated 100cc of blood between pad and blood in vaginal vault but no active bleeding from the cervical os.  However, given reports of subjective chills will treat as endometritis. CBC Hgb 11.5.    A&P: #Postpartum bleeding #S/p rLTCS  #Endometritis - Augmentin 875mg  BID 10  days - call OBGYN 12/26 to ensure they don't want sooner clinic visit   Discharge from MAU in stable condition with strict/usual precautions Follow up at [facility] as scheduled for ongoing prenatal care  Allergies as of 11/28/2023       Reactions   Sesame Oil Hives        Medication List     TAKE these medications    acetaminophen 500 MG tablet Commonly known as: TYLENOL Take 1,000 mg by mouth every 6 (six) hours as needed.   amoxicillin-clavulanate 875-125 MG tablet Commonly known as: AUGMENTIN Take 1 tablet by mouth 2 (two) times daily.   aspirin EC 81 MG tablet Take 81 mg by mouth daily. Swallow whole.   azithromycin 500 MG tablet Commonly known as: ZITHROMAX Take 1 tablet (500 mg total) by mouth daily. If you ave 3+ loose stools  in 24hr. Can stop taking if diarrhea resolves   enoxaparin 40 MG/0.4ML injection Commonly known as: LOVENOX Inject 40 mg into the skin daily.   ibuprofen 800 MG tablet Commonly known as: ADVIL Take 800 mg by mouth every 8 (eight) hours as needed.   multivitamin-prenatal 27-0.8 MG Tabs tablet Take 1 tablet by mouth daily at 12 noon.        Hessie Dibble, MD 11/28/2023 2:44 PM

## 2023-11-28 NOTE — MAU Note (Addendum)
Helene Labo is a 37 y.o. at Unknown here in MAU reporting: around 1230, was breast feeding her baby and she felt a contraction and had a gush of blood, got up and went to the bathroom, was gushing on the toilet.  Not passing clots.  Put on a new pad (med), mod amt of blood on pad, had bled through clothes and light amt on the towel she was sitting on . Reports bleeding had been very light and min pain prior to this.  This was her 2nd c/s, rtn scheduled repeat 12/20.  Had been on Lovenox, last dose was 12/18, still on ASA.  Onset of complaint: ~1230 Pain score: none Vitals:   11/28/23 1310  BP: 133/87  Pulse: 87  Resp: 18  Temp: 99.4 F (37.4 C)  SpO2: 99%      Lab orders placed from triage:    Delivered at [facility]

## 2023-12-09 DIAGNOSIS — D65 Disseminated intravascular coagulation [defibrination syndrome]: Secondary | ICD-10-CM | POA: Insufficient documentation

## 2024-09-27 ENCOUNTER — Ambulatory Visit: Admitting: Family Medicine

## 2024-10-25 ENCOUNTER — Other Ambulatory Visit (HOSPITAL_COMMUNITY): Payer: Self-pay

## 2024-10-25 ENCOUNTER — Encounter: Payer: Self-pay | Admitting: Student in an Organized Health Care Education/Training Program

## 2024-10-25 ENCOUNTER — Ambulatory Visit: Payer: Self-pay | Admitting: Student in an Organized Health Care Education/Training Program

## 2024-10-25 ENCOUNTER — Other Ambulatory Visit: Payer: Self-pay

## 2024-10-25 VITALS — BP 134/103 | HR 77 | Ht 67.0 in | Wt 189.0 lb

## 2024-10-25 DIAGNOSIS — I10 Essential (primary) hypertension: Secondary | ICD-10-CM | POA: Insufficient documentation

## 2024-10-25 DIAGNOSIS — Z13220 Encounter for screening for lipoid disorders: Secondary | ICD-10-CM | POA: Diagnosis not present

## 2024-10-25 DIAGNOSIS — J301 Allergic rhinitis due to pollen: Secondary | ICD-10-CM

## 2024-10-25 DIAGNOSIS — Z0000 Encounter for general adult medical examination without abnormal findings: Secondary | ICD-10-CM | POA: Insufficient documentation

## 2024-10-25 DIAGNOSIS — E663 Overweight: Secondary | ICD-10-CM | POA: Insufficient documentation

## 2024-10-25 DIAGNOSIS — F39 Unspecified mood [affective] disorder: Secondary | ICD-10-CM | POA: Insufficient documentation

## 2024-10-25 DIAGNOSIS — Z131 Encounter for screening for diabetes mellitus: Secondary | ICD-10-CM | POA: Diagnosis not present

## 2024-10-25 LAB — COMPREHENSIVE METABOLIC PANEL WITH GFR
ALT: 22 U/L (ref 0–35)
AST: 21 U/L (ref 0–37)
Albumin: 4.5 g/dL (ref 3.5–5.2)
Alkaline Phosphatase: 53 U/L (ref 39–117)
BUN: 9 mg/dL (ref 6–23)
CO2: 29 meq/L (ref 19–32)
Calcium: 9.2 mg/dL (ref 8.4–10.5)
Chloride: 102 meq/L (ref 96–112)
Creatinine, Ser: 0.61 mg/dL (ref 0.40–1.20)
GFR: 113.35 mL/min (ref >60.00)
Glucose, Bld: 98 mg/dL (ref 70–99)
Potassium: 3.7 meq/L (ref 3.5–5.1)
Sodium: 137 meq/L (ref 135–145)
Total Bilirubin: 0.6 mg/dL (ref 0.2–1.2)
Total Protein: 7.6 g/dL (ref 6.0–8.3)

## 2024-10-25 LAB — LIPID PANEL
Cholesterol: 212 mg/dL — ABNORMAL HIGH (ref 0–200)
HDL: 84.9 mg/dL (ref >39.00)
LDL Cholesterol: 115 mg/dL — ABNORMAL HIGH (ref 0–99)
NonHDL: 126.89
Total CHOL/HDL Ratio: 2
Triglycerides: 57 mg/dL (ref 0.0–149.0)
VLDL: 11.4 mg/dL (ref 0.0–40.0)

## 2024-10-25 LAB — TSH: TSH: 3.04 u[IU]/mL (ref 0.35–5.50)

## 2024-10-25 LAB — HEMOGLOBIN A1C: Hgb A1c MFr Bld: 5.3 % (ref 4.6–6.5)

## 2024-10-25 LAB — MICROALBUMIN / CREATININE URINE RATIO
Creatinine,U: 51.1 mg/dL
Microalb Creat Ratio: UNDETERMINED mg/g (ref 0.0–30.0)
Microalb, Ur: <0.7 mg/dL

## 2024-10-25 MED ORDER — FLUTICASONE PROPIONATE 50 MCG/ACT NA SUSP
2.0000 | Freq: Every day | NASAL | 6 refills | Status: AC
  Filled 2024-10-25: qty 16, 30d supply, fill #0

## 2024-10-25 MED ORDER — ESCITALOPRAM OXALATE 20 MG PO TABS
20.0000 mg | ORAL_TABLET | Freq: Every day | ORAL | 3 refills | Status: AC
  Filled 2024-10-25: qty 90, 90d supply, fill #0

## 2024-10-25 MED ORDER — BUPROPION HCL ER (XL) 150 MG PO TB24
150.0000 mg | ORAL_TABLET | Freq: Every day | ORAL | 1 refills | Status: AC
Start: 2024-10-25 — End: ?
  Filled 2024-10-25: qty 90, 90d supply, fill #0

## 2024-10-25 NOTE — Patient Instructions (Signed)
  VISIT SUMMARY: During your visit, we discussed your overall health, focusing on your elevated blood pressure, anxiety, weight management, and chronic cough. We also reviewed your recent lab results and discussed lifestyle modifications to improve your health.  YOUR PLAN: -ELEVATED BLOOD PRESSURE: Your blood pressure has been elevated since your last pregnancy, with readings around 134/103 mmHg. Although you have a family history of high blood pressure, your overall cardiovascular risk is low, so no medication is needed at this time. You should monitor your blood pressure at home twice a week and focus on a healthy diet, regular exercise, and stress management. We will reassess your blood pressure in 2-3 months.  -DEPRESSION AND ANXIETY: You have been experiencing increased anxiety and mood disturbances. You are currently taking Lexapro  20 mg daily. We have added Wellbutrin  to your treatment plan to help manage your symptoms. It is important to practice self-care and stress management techniques.  -OVERWEIGHT: Your current weight is 189 lbs, with a BMI of 29.6. We discussed the possibility of using a GLP-1 agonist for weight management, although it is off-label for insurance purposes. We also talked about the side effects and benefits of this medication and options through [employer]. You are encouraged to make lifestyle changes, including regular exercise and a healthy diet, to help manage your weight.  -CHRONIC COUGH AND ALLERGIC RHINITIS: You have a persistent cough and nasal congestion likely due to allergies. Continuing to use Claritin should help manage your symptoms.  -GENERAL HEALTH MAINTENANCE: We ordered blood work to check your cholesterol, A1c, thyroid , and TSH levels. It is important to engage in regular exercise, maintain a healthy diet, and ensure you get adequate sleep.  INSTRUCTIONS: Please monitor your blood pressure at home twice weekly and keep a record of your readings. We will  reassess your blood pressure in 2-3 months. Continue taking Lexapro  20 mg daily and start Wellbutrin  as prescribed. Maintain your current use of Claritin for allergy management. Make lifestyle changes to help with weight management, including regular exercise and a healthy diet. Follow up with the blood work we ordered to check your cholesterol, A1c, thyroid , and TSH levels.

## 2024-10-25 NOTE — Assessment & Plan Note (Signed)
 During her routine wellness visit, the focus was on lifestyle modifications for health improvement. Blood work was ordered to check cholesterol, A1c, thyroid , and TSH levels. She was encouraged to engage in regular exercise, maintain a healthy diet, and ensure adequate sleep.  No issues with substance use.  She is up-to-date on cervical cancer screening, next will be due in 2030.

## 2024-10-25 NOTE — Progress Notes (Signed)
 Complete physical exam  Patient: Melissa Doyle    DOB: [birth year] 38 y.o.   MRN: [id number]  Chief Complaint  Patient presents with   Establish Care    Other concerns      Subjective:    Melissa Doyle is a 38 y.o. female who presents today for a complete physical exam. She reports consuming a general diet. The patient does not participate in regular exercise at present. She generally feels fairly well. She reports sleeping poorly. She does have additional problems to discuss today.   Discussed the use of AI scribe software for clinical note transcription with the patient, who gave verbal consent to proceed.  History of Present Illness Melissa Doyle is a 38 year old female who presents for a physical exam and evaluation of elevated blood pressure and anxiety.  She has experienced elevated blood pressure since her last pregnancy, with consistent readings around 130/103 mmHg. She has no prior history of hypertension. She attributes some of her current stress to her new job and family responsibilities. Her family history includes high blood pressure and cardiomyopathy in her mother.  She describes increased anxiety and mood disturbances, feeling 'on edge' and overwhelmed, particularly with her children's behavior. She has been on Lexapro  20 mg daily for four years, initially increased due to postpartum anxiety. Recently, her anxiety and mood issues have been creeping back.  She reports a persistent cough and nasal congestion that have worsened over the past few years. She has been using Claritin, which provides some relief, but the symptoms remain constant.  She has a history of postpartum complications, including disseminated intravascular coagulation (DIC) and easy bruising, which have since resolved. She recently had lab work done by her OB to ensure everything was normal, and results were satisfactory.  She has experienced weight gain, currently weighing 189 pounds, which is the  heaviest she has been. She is concerned about her weight, noting a previous weight of 157 pounds.  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 She has two young children, ages four and one, and reports significant stress related to their care and sleep issues. Her husband is supportive, and she both work full-time. She has limited family support nearby, with her mother being a source of stress.  Patient Care Team: Melissa Cleatus Ned, MD as PCP - General (Internal Medicine)      Objective:    BP (!) 134/103   Pulse 77   Ht 5' 7 (1.702 m)   Wt 189 lb (85.7 kg)   BMI 29.60 kg/m   Physical Exam   Gen: Well appearing woman Ears: Normal hearing, normal ear canals, normal tympanic membranes bilaterally Mouth: No oral lesions Neck: Normal thyroid , no nodules or adenopathy Heart: Regular, no murmur Lungs: Unlabored, clear throughout Abd: Soft, nontender, no organomegaly, no hernias Ext: Warm, no edema NEuro: Alert, conversational, full strength upper and lower extremities, normal gait and balance Psych: Appropriate mood affect, mildly depressed appearing, normal speech and organized thoughts      Assessment & Plan:    Routine Health Maintenance and Physical Exam Immunization History  Administered Date(s) Administered    sv, Bivalent, Protein Subunit Rsvpref,pf Marlow) 11/03/2023   Hepatitis A, Ped/Adol-2 Dose 12/14/2016   Influenza-Unspecified 09/06/2013, 09/23/2014, 09/10/2018, 09/09/2019, 09/09/2020, 09/15/2021   MMR 09/29/1987, 06/28/1991   Moderna Sars-Covid-2 Vaccination 11/27/2019   PFIZER(Purple Top)SARS-COV-2 Vaccination 12/17/2019   Tdap 05/22/2015   Typhoid Live  11/04/2016   Yellow Fever 12/14/2016    Health Maintenance  Topic Date Due   HIV Screening  Never done   Hepatitis C Screening  Never done   Hepatitis B Vaccines 19-59 Average Risk (1 of 3 - 19+ 3-dose series) Never done   HPV  VACCINES (1 - Risk 3-dose SCDM series) Never done   Cervical Cancer Screening (HPV/Pap Cotest)  04/24/2016   COVID-19 Vaccine (3 - Mixed Product risk series) 01/14/2020   Influenza Vaccine  07/05/2024   DTaP/Tdap/Td (2 - Td or Tdap) 05/21/2025   Pneumococcal Vaccine  Aged Out   Meningococcal B Vaccine  Aged Out    Discussed health benefits of physical activity, and encouraged her to engage in regular exercise appropriate for her age and condition.  In addition to a complete physical exam, we also did a separate encounter for chronic uncontrolled issues of mood disorder.  Problem List Items Addressed This Visit       High   Hypertension (Chronic)   Her blood pressure is elevated postpartum with readings of 134/103 mmHg. Despite a family history of hypertension, her cardiovascular risk is low, so no antihypertensive medication is needed right now, but likely will be in the future. She should monitor her blood pressure at home twice weekly and focus on diet, stress management, and exercise. Blood pressure will be reassessed in 2-3 months.      Relevant Orders   Comprehensive metabolic panel with GFR   Microalbumin / creatinine urine ratio   Mood disorder (Chronic)   She experiences increased anxiety related to stress and is currently on Lexapro  20 mg. Wellbutrin  was prescribed to be taken with Lexapro . She was encouraged to practice self-care and stress management.      Relevant Medications   buPROPion  (WELLBUTRIN  XL) 150 MG 24 hr tablet   escitalopram  (LEXAPRO ) 20 MG tablet     Medium    Allergic rhinitis (Chronic)   She has a chronic cough and nasal congestion likely due to allergies. Claritin provides some relief and should be continued for allergy management.  I also recommended daily sinus irrigation and daily intranasal Flonase  with good technique.      Relevant Medications   fluticasone  (FLONASE ) 50 MCG/ACT nasal spray   Overweight (BMI 25.0-29.9) (Chronic)   She weighs  189 lbs with a BMI of 29.6. A GLP-1 agonist was discussed for weight management. The side effects and benefits were explained, and options through [employer] were discussed. Lifestyle modifications for weight management were encouraged.      Relevant Orders   Comprehensive metabolic panel with GFR   TSH     Low   Health maintenance examination - Primary (Chronic)   During her routine wellness visit, the focus was on lifestyle modifications for health improvement. Blood work was ordered to check cholesterol, A1c, thyroid , and TSH levels. She was encouraged to engage in regular exercise, maintain a healthy diet, and ensure adequate sleep.  No issues with substance use.  She is up-to-date on cervical cancer screening, next will be due in 2030.      Other Visit Diagnoses       Screening for lipid disorders       Relevant Orders   Lipid panel     Screening for diabetes mellitus       Relevant Orders   Hemoglobin A1c       Return in about 3 months (around 01/25/2025).    Cleatus Debby Specking, MD [facility]  at [facility]

## 2024-10-25 NOTE — Assessment & Plan Note (Signed)
 She weighs 189 lbs with a BMI of 29.6. A GLP-1 agonist was discussed for weight management. The side effects and benefits were explained, and options through [employer] were discussed. Lifestyle modifications for weight management were encouraged.

## 2024-10-25 NOTE — Assessment & Plan Note (Signed)
 She experiences increased anxiety related to stress and is currently on Lexapro  20 mg. Wellbutrin  was prescribed to be taken with Lexapro . She was encouraged to practice self-care and stress management.

## 2024-10-25 NOTE — Assessment & Plan Note (Signed)
 Her blood pressure is elevated postpartum with readings of 134/103 mmHg. Despite a family history of hypertension, her cardiovascular risk is low, so no antihypertensive medication is needed right now, but likely will be in the future. She should monitor her blood pressure at home twice weekly and focus on diet, stress management, and exercise. Blood pressure will be reassessed in 2-3 months.

## 2024-10-25 NOTE — Assessment & Plan Note (Signed)
 She has a chronic cough and nasal congestion likely due to allergies. Claritin provides some relief and should be continued for allergy management.  I also recommended daily sinus irrigation and daily intranasal Flonase  with good technique.

## 2024-10-28 ENCOUNTER — Ambulatory Visit: Payer: Self-pay | Admitting: Student in an Organized Health Care Education/Training Program

## 2024-10-28 ENCOUNTER — Other Ambulatory Visit (HOSPITAL_COMMUNITY): Payer: Self-pay

## 2024-11-25 ENCOUNTER — Other Ambulatory Visit (HOSPITAL_COMMUNITY): Payer: Self-pay

## 2024-11-25 MED ORDER — LOTEPREDNOL ETABONATE 0.5 % OP GEL
1.0000 [drp] | Freq: Four times a day (QID) | OPHTHALMIC | 0 refills | Status: AC
  Filled 2024-11-25: qty 5, 25d supply, fill #0

## 2024-11-25 MED ORDER — ESCITALOPRAM OXALATE 20 MG PO TABS
20.0000 mg | ORAL_TABLET | Freq: Every day | ORAL | 3 refills | Status: AC
  Filled 2024-11-25: qty 90, 90d supply, fill #0

## 2024-11-25 MED ORDER — CYCLOSPORINE 0.05 % OP EMUL
1.0000 [drp] | Freq: Two times a day (BID) | OPHTHALMIC | 3 refills | Status: AC
  Filled 2024-11-25: qty 60, 30d supply, fill #0

## 2024-11-26 ENCOUNTER — Other Ambulatory Visit (HOSPITAL_COMMUNITY): Payer: Self-pay

## 2024-11-27 ENCOUNTER — Other Ambulatory Visit (HOSPITAL_COMMUNITY): Payer: Self-pay

## 2024-11-29 ENCOUNTER — Other Ambulatory Visit (HOSPITAL_COMMUNITY): Payer: Self-pay

## 2024-12-22 ENCOUNTER — Other Ambulatory Visit (HOSPITAL_COMMUNITY): Payer: Self-pay

## 2024-12-26 ENCOUNTER — Other Ambulatory Visit: Payer: Self-pay

## 2024-12-26 ENCOUNTER — Other Ambulatory Visit (HOSPITAL_COMMUNITY): Payer: Self-pay

## 2024-12-26 MED ORDER — TIRZEPATIDE-WEIGHT MANAGEMENT 2.5 MG/0.5ML ~~LOC~~ SOAJ
2.5000 mg | SUBCUTANEOUS | 0 refills | Status: AC
  Filled 2024-12-26 – 2025-01-01 (×2): qty 2, 28d supply, fill #0

## 2025-01-01 ENCOUNTER — Other Ambulatory Visit (HOSPITAL_COMMUNITY): Payer: Self-pay

## 2025-01-01 MED ORDER — MELOXICAM 7.5 MG PO TABS
7.5000 mg | ORAL_TABLET | Freq: Two times a day (BID) | ORAL | 0 refills | Status: AC
  Filled 2025-01-01: qty 60, 30d supply, fill #0

## 2025-01-06 ENCOUNTER — Other Ambulatory Visit (HOSPITAL_COMMUNITY): Payer: Self-pay

## 2025-01-27 ENCOUNTER — Ambulatory Visit: Admitting: Student in an Organized Health Care Education/Training Program
